# Patient Record
Sex: Female | Born: 1955 | Race: Black or African American | Hispanic: No | Marital: Single | Smoking: Former smoker
Health system: Southern US, Community
[De-identification: ages and names within clinical notes are randomized; demographics above are authoritative.]

## PROBLEM LIST (undated history)

## (undated) DIAGNOSIS — I10 Essential (primary) hypertension: Secondary | ICD-10-CM

## (undated) DIAGNOSIS — IMO0001 Reserved for inherently not codable concepts without codable children: Secondary | ICD-10-CM

## (undated) DIAGNOSIS — E119 Type 2 diabetes mellitus without complications: Secondary | ICD-10-CM

## (undated) DIAGNOSIS — Z794 Long term (current) use of insulin: Secondary | ICD-10-CM

## (undated) DIAGNOSIS — E785 Hyperlipidemia, unspecified: Secondary | ICD-10-CM

## (undated) DIAGNOSIS — J45909 Unspecified asthma, uncomplicated: Secondary | ICD-10-CM

## (undated) DIAGNOSIS — E039 Hypothyroidism, unspecified: Secondary | ICD-10-CM

## (undated) HISTORY — PX: JOINT REPLACEMENT: SHX530

---

## 2013-08-04 HISTORY — PX: TOTAL KNEE ARTHROPLASTY: SHX125

## 2015-06-12 ENCOUNTER — Encounter: Payer: Self-pay | Admitting: Emergency Medicine

## 2015-06-12 ENCOUNTER — Inpatient Hospital Stay: Admit: 2015-06-12 | Payer: Medicaid - Out of State

## 2015-06-12 ENCOUNTER — Emergency Department: Payer: Medicaid - Out of State

## 2015-06-12 ENCOUNTER — Inpatient Hospital Stay
Admission: EM | Admit: 2015-06-12 | Discharge: 2015-06-14 | DRG: 292 | Disposition: A | Discharge status: Home / Self Care | Payer: Medicaid - Out of State | Attending: Internal Medicine | Admitting: Internal Medicine

## 2015-06-12 DIAGNOSIS — I340 Nonrheumatic mitral (valve) insufficiency: Secondary | ICD-10-CM | POA: Diagnosis not present

## 2015-06-12 DIAGNOSIS — Z794 Long term (current) use of insulin: Secondary | ICD-10-CM

## 2015-06-12 DIAGNOSIS — J45909 Unspecified asthma, uncomplicated: Secondary | ICD-10-CM | POA: Diagnosis present

## 2015-06-12 DIAGNOSIS — E785 Hyperlipidemia, unspecified: Secondary | ICD-10-CM | POA: Diagnosis present

## 2015-06-12 DIAGNOSIS — I5031 Acute diastolic (congestive) heart failure: Secondary | ICD-10-CM | POA: Diagnosis present

## 2015-06-12 DIAGNOSIS — E039 Hypothyroidism, unspecified: Secondary | ICD-10-CM | POA: Diagnosis present

## 2015-06-12 DIAGNOSIS — Z6841 Body Mass Index (BMI) 40.0 and over, adult: Secondary | ICD-10-CM

## 2015-06-12 DIAGNOSIS — Z79899 Other long term (current) drug therapy: Secondary | ICD-10-CM | POA: Diagnosis not present

## 2015-06-12 DIAGNOSIS — R0601 Orthopnea: Secondary | ICD-10-CM | POA: Diagnosis present

## 2015-06-12 DIAGNOSIS — E876 Hypokalemia: Secondary | ICD-10-CM | POA: Diagnosis present

## 2015-06-12 DIAGNOSIS — Z87891 Personal history of nicotine dependence: Secondary | ICD-10-CM

## 2015-06-12 DIAGNOSIS — Z886 Allergy status to analgesic agent status: Secondary | ICD-10-CM

## 2015-06-12 DIAGNOSIS — E119 Type 2 diabetes mellitus without complications: Secondary | ICD-10-CM | POA: Diagnosis present

## 2015-06-12 DIAGNOSIS — Z823 Family history of stroke: Secondary | ICD-10-CM

## 2015-06-12 DIAGNOSIS — Z96659 Presence of unspecified artificial knee joint: Secondary | ICD-10-CM | POA: Diagnosis present

## 2015-06-12 DIAGNOSIS — I10 Essential (primary) hypertension: Secondary | ICD-10-CM

## 2015-06-12 DIAGNOSIS — Z8249 Family history of ischemic heart disease and other diseases of the circulatory system: Secondary | ICD-10-CM

## 2015-06-12 DIAGNOSIS — I509 Heart failure, unspecified: Secondary | ICD-10-CM

## 2015-06-12 DIAGNOSIS — R0602 Shortness of breath: Secondary | ICD-10-CM

## 2015-06-12 DIAGNOSIS — Z7951 Long term (current) use of inhaled steroids: Secondary | ICD-10-CM

## 2015-06-12 DIAGNOSIS — IMO0001 Reserved for inherently not codable concepts without codable children: Secondary | ICD-10-CM

## 2015-06-12 HISTORY — DX: Essential (primary) hypertension: I10

## 2015-06-12 HISTORY — DX: Long term (current) use of insulin: Z79.4

## 2015-06-12 HISTORY — DX: Hyperlipidemia, unspecified: E78.5

## 2015-06-12 HISTORY — DX: Hypothyroidism, unspecified: E03.9

## 2015-06-12 HISTORY — DX: Reserved for inherently not codable concepts without codable children: IMO0001

## 2015-06-12 HISTORY — DX: Type 2 diabetes mellitus without complications: E11.9

## 2015-06-12 HISTORY — DX: Unspecified asthma, uncomplicated: J45.909

## 2015-06-12 HISTORY — DX: Morbid (severe) obesity due to excess calories: E66.01

## 2015-06-12 LAB — BASIC METABOLIC PANEL
Anion gap: 10 (ref 5–15)
BUN: 26 mg/dL — ABNORMAL HIGH (ref 6–20)
CO2: 31 mmol/L (ref 22–32)
Calcium: 8.3 mg/dL — ABNORMAL LOW (ref 8.9–10.3)
Chloride: 95 mmol/L — ABNORMAL LOW (ref 101–111)
Creatinine, Ser: 1.29 mg/dL — ABNORMAL HIGH (ref 0.44–1.00)
GFR calc Af Amer: 51 mL/min — ABNORMAL LOW (ref >60)
GFR calc non Af Amer: 44 mL/min — ABNORMAL LOW (ref >60)
Glucose, Bld: 85 mg/dL (ref 65–99)
Potassium: 3 mmol/L — ABNORMAL LOW (ref 3.5–5.1)
Sodium: 136 mmol/L (ref 135–145)

## 2015-06-12 LAB — CBC WITH DIFFERENTIAL/PLATELET
BASOS PCT: 0 %
Basophils Absolute: 0 10*3/uL (ref 0–0.1)
EOS ABS: 0.2 10*3/uL (ref 0–0.7)
Eosinophils Relative: 2 %
HCT: 39.8 % (ref 35.0–47.0)
HEMOGLOBIN: 13 g/dL (ref 12.0–16.0)
LYMPHS PCT: 16 %
Lymphs Abs: 1.4 10*3/uL (ref 1.0–3.6)
MCH: 29.1 pg (ref 26.0–34.0)
MCHC: 32.5 g/dL (ref 32.0–36.0)
MCV: 89.6 fL (ref 80.0–100.0)
MONOS PCT: 7 %
Monocytes Absolute: 0.7 10*3/uL (ref 0.2–0.9)
NEUTROS PCT: 75 %
Neutro Abs: 6.6 10*3/uL — ABNORMAL HIGH (ref 1.4–6.5)
PLATELETS: 210 10*3/uL (ref 150–440)
RBC: 4.45 MIL/uL (ref 3.80–5.20)
RDW: 14.9 % — ABNORMAL HIGH (ref 11.5–14.5)
WBC: 8.9 10*3/uL (ref 3.6–11.0)

## 2015-06-12 LAB — GLUCOSE, CAPILLARY: Glucose-Capillary: 76 mg/dL (ref 65–99)

## 2015-06-12 LAB — MAGNESIUM: MAGNESIUM: 1.9 mg/dL (ref 1.7–2.4)

## 2015-06-12 LAB — BRAIN NATRIURETIC PEPTIDE: B NATRIURETIC PEPTIDE 5: 928 pg/mL — AB (ref 0.0–100.0)

## 2015-06-12 LAB — TROPONIN I: Troponin I: 0.03 ng/mL (ref <0.031)

## 2015-06-12 MED ORDER — HYDRALAZINE HCL 20 MG/ML IJ SOLN
10.0000 mg | Freq: Four times a day (QID) | INTRAMUSCULAR | Status: DC | PRN
  Administered 2015-06-12 (×2): 10 mg via INTRAVENOUS
  Filled 2015-06-12: qty 1

## 2015-06-12 MED ORDER — LOSARTAN POTASSIUM 50 MG PO TABS
100.0000 mg | ORAL_TABLET | Freq: Every day | ORAL | Status: DC
  Administered 2015-06-12 – 2015-06-14 (×3): 100 mg via ORAL
  Filled 2015-06-12 (×3): qty 2

## 2015-06-12 MED ORDER — INSULIN GLARGINE 100 UNIT/ML ~~LOC~~ SOLN
35.0000 [IU] | Freq: Every evening | SUBCUTANEOUS | Status: DC
  Administered 2015-06-12 – 2015-06-13 (×2): 35 [IU] via SUBCUTANEOUS
  Filled 2015-06-12 (×3): qty 0.35

## 2015-06-12 MED ORDER — ONDANSETRON HCL 4 MG PO TABS: 4.0000 mg | ORAL_TABLET | Freq: Four times a day (QID) | ORAL | Status: DC | PRN

## 2015-06-12 MED ORDER — ONDANSETRON HCL 4 MG/2ML IJ SOLN: 4.0000 mg | Freq: Four times a day (QID) | INTRAMUSCULAR | Status: DC | PRN

## 2015-06-12 MED ORDER — FUROSEMIDE 10 MG/ML IJ SOLN
INTRAMUSCULAR | Status: AC
  Administered 2015-06-12: 40 mg via INTRAVENOUS
  Filled 2015-06-12: qty 4

## 2015-06-12 MED ORDER — LEVOTHYROXINE SODIUM 125 MCG PO TABS
125.0000 ug | ORAL_TABLET | Freq: Every day | ORAL | Status: DC
  Administered 2015-06-13 – 2015-06-14 (×2): 125 ug via ORAL
  Filled 2015-06-12 (×2): qty 1

## 2015-06-12 MED ORDER — HYDRALAZINE HCL 20 MG/ML IJ SOLN
INTRAMUSCULAR | Status: AC
  Administered 2015-06-12: 10 mg via INTRAVENOUS
  Filled 2015-06-12: qty 1

## 2015-06-12 MED ORDER — FUROSEMIDE 10 MG/ML IJ SOLN
40.0000 mg | Freq: Two times a day (BID) | INTRAMUSCULAR | Status: DC
  Administered 2015-06-12 – 2015-06-14 (×3): 40 mg via INTRAVENOUS
  Filled 2015-06-12 (×3): qty 4

## 2015-06-12 MED ORDER — NITROGLYCERIN 2 % TD OINT
TOPICAL_OINTMENT | TRANSDERMAL | Status: AC
  Filled 2015-06-12: qty 1

## 2015-06-12 MED ORDER — FUROSEMIDE 40 MG PO TABS
ORAL_TABLET | ORAL | Status: AC
  Filled 2015-06-12: qty 1

## 2015-06-12 MED ORDER — ATORVASTATIN CALCIUM 20 MG PO TABS
40.0000 mg | ORAL_TABLET | Freq: Every day | ORAL | Status: DC
  Administered 2015-06-12 – 2015-06-14 (×3): 40 mg via ORAL
  Filled 2015-06-12 (×3): qty 2

## 2015-06-12 MED ORDER — BUDESONIDE-FORMOTEROL FUMARATE 160-4.5 MCG/ACT IN AERO
2.0000 | INHALATION_SPRAY | Freq: Two times a day (BID) | RESPIRATORY_TRACT | Status: DC
  Administered 2015-06-12 – 2015-06-14 (×4): 2 via RESPIRATORY_TRACT
  Filled 2015-06-12: qty 6

## 2015-06-12 MED ORDER — INSULIN ASPART 100 UNIT/ML ~~LOC~~ SOLN
0.0000 [IU] | Freq: Every day | SUBCUTANEOUS | Status: DC
  Administered 2015-06-13: 2 [IU] via SUBCUTANEOUS
  Filled 2015-06-12: qty 2

## 2015-06-12 MED ORDER — ASPIRIN 81 MG PO CHEW
CHEWABLE_TABLET | ORAL | Status: AC
  Administered 2015-06-12: 324 mg via ORAL
  Filled 2015-06-12: qty 4

## 2015-06-12 MED ORDER — ALBUTEROL SULFATE (2.5 MG/3ML) 0.083% IN NEBU: 2.5000 mg | INHALATION_SOLUTION | Freq: Four times a day (QID) | RESPIRATORY_TRACT | Status: DC | PRN

## 2015-06-12 MED ORDER — IPRATROPIUM-ALBUTEROL 0.5-2.5 (3) MG/3ML IN SOLN
3.0000 mL | Freq: Once | RESPIRATORY_TRACT | Status: AC
  Administered 2015-06-12: 3 mL via RESPIRATORY_TRACT
  Filled 2015-06-12: qty 3

## 2015-06-12 MED ORDER — SODIUM CHLORIDE 0.9 % IJ SOLN
3.0000 mL | Freq: Two times a day (BID) | INTRAMUSCULAR | Status: DC
  Administered 2015-06-12 – 2015-06-14 (×4): 3 mL via INTRAVENOUS

## 2015-06-12 MED ORDER — ENOXAPARIN SODIUM 40 MG/0.4ML ~~LOC~~ SOLN
40.0000 mg | Freq: Two times a day (BID) | SUBCUTANEOUS | Status: DC
  Administered 2015-06-12 – 2015-06-14 (×3): 40 mg via SUBCUTANEOUS
  Filled 2015-06-12 (×4): qty 0.4

## 2015-06-12 MED ORDER — INSULIN ASPART 100 UNIT/ML ~~LOC~~ SOLN
0.0000 [IU] | Freq: Three times a day (TID) | SUBCUTANEOUS | Status: DC
  Administered 2015-06-13: 5 [IU] via SUBCUTANEOUS
  Administered 2015-06-13: 2 [IU] via SUBCUTANEOUS
  Administered 2015-06-14: 3 [IU] via SUBCUTANEOUS
  Administered 2015-06-14: 2 [IU] via SUBCUTANEOUS
  Filled 2015-06-12: qty 2
  Filled 2015-06-12: qty 5
  Filled 2015-06-12: qty 3
  Filled 2015-06-12: qty 2

## 2015-06-12 MED ORDER — CARVEDILOL 25 MG PO TABS
25.0000 mg | ORAL_TABLET | Freq: Two times a day (BID) | ORAL | Status: DC
  Administered 2015-06-12 – 2015-06-14 (×4): 25 mg via ORAL
  Filled 2015-06-12 (×4): qty 1

## 2015-06-12 MED ORDER — CLONIDINE HCL 0.1 MG PO TABS
0.2000 mg | ORAL_TABLET | Freq: Two times a day (BID) | ORAL | Status: DC
  Administered 2015-06-12 – 2015-06-14 (×4): 0.2 mg via ORAL
  Filled 2015-06-12 (×4): qty 2

## 2015-06-12 MED ORDER — POTASSIUM CHLORIDE CRYS ER 20 MEQ PO TBCR
20.0000 meq | EXTENDED_RELEASE_TABLET | Freq: Two times a day (BID) | ORAL | Status: DC
  Administered 2015-06-12 – 2015-06-14 (×4): 20 meq via ORAL
  Filled 2015-06-12 (×3): qty 1

## 2015-06-12 MED ORDER — FUROSEMIDE 20 MG PO TABS
20.0000 mg | ORAL_TABLET | Freq: Once | ORAL | Status: AC
  Administered 2015-06-12: 20 mg via ORAL

## 2015-06-12 MED ORDER — NITROGLYCERIN 0.4 MG SL SUBL
0.4000 mg | SUBLINGUAL_TABLET | Freq: Once | SUBLINGUAL | Status: AC
  Administered 2015-06-12: 0.4 mg via SUBLINGUAL
  Filled 2015-06-12: qty 25

## 2015-06-12 MED ORDER — HYDRALAZINE HCL 25 MG PO TABS
25.0000 mg | ORAL_TABLET | Freq: Four times a day (QID) | ORAL | Status: DC
  Administered 2015-06-12 – 2015-06-13 (×2): 25 mg via ORAL
  Filled 2015-06-12 (×4): qty 1

## 2015-06-12 MED ORDER — NITROGLYCERIN 2 % TD OINT
1.0000 [in_us] | TOPICAL_OINTMENT | Freq: Once | TRANSDERMAL | Status: AC
  Administered 2015-06-12: 1 [in_us] via TOPICAL

## 2015-06-12 MED ORDER — ACETAMINOPHEN 325 MG PO TABS
650.0000 mg | ORAL_TABLET | Freq: Four times a day (QID) | ORAL | Status: DC | PRN
  Administered 2015-06-14: 650 mg via ORAL
  Filled 2015-06-12: qty 2

## 2015-06-12 MED ORDER — ASPIRIN EC 81 MG PO TBEC
81.0000 mg | DELAYED_RELEASE_TABLET | Freq: Every day | ORAL | Status: DC
  Administered 2015-06-12 – 2015-06-14 (×3): 81 mg via ORAL
  Filled 2015-06-12 (×3): qty 1

## 2015-06-12 MED ORDER — ISOSORBIDE MONONITRATE ER 30 MG PO TB24
30.0000 mg | ORAL_TABLET | Freq: Every day | ORAL | Status: DC
  Administered 2015-06-13 – 2015-06-14 (×2): 30 mg via ORAL
  Filled 2015-06-12 (×3): qty 1

## 2015-06-12 MED ORDER — ASPIRIN 81 MG PO CHEW
324.0000 mg | CHEWABLE_TABLET | Freq: Once | ORAL | Status: AC
  Administered 2015-06-12: 324 mg via ORAL
  Filled 2015-06-12: qty 4

## 2015-06-12 MED ORDER — ACETAMINOPHEN 650 MG RE SUPP: 650.0000 mg | Freq: Four times a day (QID) | RECTAL | Status: DC | PRN

## 2015-06-12 NOTE — ED Notes (Addendum)
Pt has history of asthma and reports wheezing with no relief from inhalers for the last 2 days. Pt unable to eat normally due to shortness of breath.

## 2015-06-12 NOTE — H&P (Signed)
[facility] - [place] at [facility]   PATIENT NAME: Ashley Graves    MR#:  [id number]  DATE OF BIRTH:  1956-03-22  DATE OF ADMISSION:  06/12/2015  PRIMARY CARE PHYSICIAN: Non-local but in [place]   REQUESTING/REFERRING PHYSICIAN: Dr. Daryel November  CHIEF COMPLAINT:   Chief Complaint  Patient presents with  . Wheezing   shortness of breath, lower extremity edema.  HISTORY OF PRESENT ILLNESS:  Ashley Graves  is a 59 y.o. female with a known history of hypertension, diabetes, asthma, obesity, who presents to the hospital with a one-week history of worsening shortness of breath and lower extremity edema. Patient does not have established physician in this area and is staying with her daughter for the past 2 months. Patient noticed that over the past week her lower extremities have been more swollen and she is having worsening exertional dyspnea. She also admits to 2-3 pillow orthopnea, paroxysmal nocturnal dyspnea.  Patient denies any chest pain, nausea, vomiting, diarrhea or any other associated symptoms. Patient was noted to have hypertensive urgency with systolic blood pressures over 409W and noted to be in congestive heart failure. Hospitalist services were contacted for further treatment and evaluation.  PAST MEDICAL HISTORY:   Past Medical History  Diagnosis Date  . Hypertension   . Diabetes mellitus without complication   . Asthma   . Thyroid disease     PAST SURGICAL HISTORY:   Past Surgical History  Procedure Laterality Date  . Joint replacement    . Total knee arthroplasty  08/04/2013    SOCIAL HISTORY:   History  Substance Use Topics  . Smoking status: Former Smoker -- 0.50 packs/day for 30 years    Types: Cigarettes  . Smokeless tobacco: Former Neurosurgeon    Quit date: 11/05/1999  . Alcohol Use: No    FAMILY HISTORY:   Family History  Problem Relation Age of Onset  . CVA Mother   . Congestive Heart Failure Father     DRUG  ALLERGIES:   Allergies  Allergen Reactions  . Codeine Other (See Comments)    Reaction:  Hallucinations     REVIEW OF SYSTEMS:   Review of Systems  Constitutional: Negative for fever and weight loss.  HENT: Negative for congestion, nosebleeds and tinnitus.   Eyes: Negative for blurred vision, double vision and redness.  Respiratory: Positive for cough, shortness of breath and wheezing. Negative for hemoptysis.   Cardiovascular: Positive for leg swelling. Negative for chest pain, orthopnea and PND.  Gastrointestinal: Positive for nausea. Negative for vomiting, abdominal pain, diarrhea and melena.  Genitourinary: Negative for dysuria, urgency and hematuria.  Musculoskeletal: Negative for joint pain and falls.  Neurological: Negative for dizziness, tingling, sensory change, focal weakness, seizures, weakness and headaches.  Endo/Heme/Allergies: Negative for polydipsia. Does not bruise/bleed easily.  Psychiatric/Behavioral: Negative for depression and memory loss. The patient is not nervous/anxious.     MEDICATIONS AT HOME:   Prior to Admission medications   Medication Sig Start Date End Date Taking? Authorizing Provider  albuterol (PROVENTIL HFA;VENTOLIN HFA) 108 (90 BASE) MCG/ACT inhaler Inhale 2 puffs into the lungs every 6 (six) hours as needed for wheezing or shortness of breath.   Yes Historical Provider, MD  aspirin EC 81 MG tablet Take 81 mg by mouth daily.   Yes Historical Provider, MD  atorvastatin (LIPITOR) 40 MG tablet Take 40 mg by mouth daily.   Yes Historical Provider, MD  budesonide-formoterol (SYMBICORT) 160-4.5 MCG/ACT inhaler Inhale 2 puffs into the lungs  2 (two) times daily.   Yes Historical Provider, MD  carvedilol (COREG) 25 MG tablet Take 25 mg by mouth 2 (two) times daily.   Yes Historical Provider, MD  cloNIDine (CATAPRES) 0.1 MG tablet Take 0.1 mg by mouth 2 (two) times daily.   Yes Historical Provider, MD  hydrALAZINE (APRESOLINE) 25 MG tablet Take 25 mg by  mouth 2 (two) times daily.   Yes Historical Provider, MD  insulin glargine (LANTUS) 100 UNIT/ML injection Inject 35 Units into the skin every evening.   Yes Historical Provider, MD  insulin lispro (HUMALOG) 100 UNIT/ML injection Inject 7 Units into the skin 3 (three) times daily before meals.   Yes Historical Provider, MD  isosorbide mononitrate (IMDUR) 30 MG 24 hr tablet Take 30 mg by mouth daily.   Yes Historical Provider, MD  levothyroxine (SYNTHROID, LEVOTHROID) 125 MCG tablet Take 125 mcg by mouth daily before breakfast.   Yes Historical Provider, MD  losartan (COZAAR) 100 MG tablet Take 100 mg by mouth daily.   Yes Historical Provider, MD  naproxen sodium (ANAPROX) 220 MG tablet Take 440 mg by mouth daily.   Yes Historical Provider, MD      VITAL SIGNS:  Blood pressure 251/119, pulse 78, temperature 98.6 F (37 C), temperature source Oral, resp. rate 20, height  (1.575 m), weight 108.863 kg (240 lb), SpO2 95 %.  PHYSICAL EXAMINATION:  Physical Exam  GENERAL:  59 y.o.-year-old obese patient lying in the bed with no acute distress.  EYES: Pupils equal, round, reactive to light and accommodation. No scleral icterus. Extraocular muscles intact.  HEENT: Head atraumatic, normocephalic. Oropharynx and nasopharynx clear. No oropharyngeal erythema, moist oral mucosa  NECK:  Supple, no jugular venous distention. No thyroid enlargement, no tenderness.  LUNGS: Positive end expiratory wheezing. Positive bibasilar rales. Positive use of accessory muscles. No dullness to percussion. No rhonchi. CARDIOVASCULAR: S1, S2 RRR. No murmurs, rubs, gallops, clicks.  ABDOMEN: Soft, nontender, nondistended. Bowel sounds present. No organomegaly or mass.  EXTREMITIES: + 2 pitting edema from knees to ankles bilaterally, no cyanosis, or clubbing. + 2 pedal & radial pulses b/l.   NEUROLOGIC: Cranial nerves II through XII are intact. No focal Motor or sensory deficits appreciated b/l PSYCHIATRIC: The patient  is alert and oriented x 3. Good affect.  SKIN: No obvious rash, lesion, or ulcer.   LABORATORY PANEL:   CBC  Recent Labs Lab 06/12/15 1537  WBC 8.9  HGB 13.0  HCT 39.8  PLT 210   ------------------------------------------------------------------------------------------------------------------  Chemistries   Recent Labs Lab 06/12/15 1537  NA 136  K 3.0*  CL 95*  CO2 31  GLUCOSE 85  BUN 26*  CREATININE 1.29*  CALCIUM 8.3*   ------------------------------------------------------------------------------------------------------------------  Cardiac Enzymes  Recent Labs Lab 06/12/15 1537  TROPONINI 0.03   ------------------------------------------------------------------------------------------------------------------  RADIOLOGY:  Dg Chest 2 View  06/12/2015   CLINICAL DATA:  Mid chest pain.  Shortness of breath.  EXAM: CHEST  2 VIEW  COMPARISON:  None.  FINDINGS: There is cardiomegaly. There are tiny bilateral effusions. Pulmonary vascularity is within normal limits. Tiny areas atelectasis at both lung bases laterally. No osseous abnormality.  IMPRESSION: Cardiomegaly with tiny effusions and minimal bibasilar atelectasis. The possibility of congestive heart failure should be considered.   Electronically Signed   By: Francene Boyers M.D.   On: 06/12/2015 16:05     IMPRESSION AND PLAN:   59 year old female with past medical history of obesity, diabetes, hypertension, asthma who presents to the hospital due  to worsening lower extremity edema and shortness of breath and noted to be in congestive heart failure.   #1 CHF-this is likely cause of patient's shortness of breath, lower extremity edema. -We'll diurese with IV Lasix, follow I's and O's, daily weights. -Continue Coreg. We'll check a two-dimensional echocardiogram. We'll get a cardiology consult.  #2 hypertensive urgency-patient's systolic blood pressures over 782. I suspect this probably related to  noncompliance. -Continue Coreg, Imdur, clonidine, hydralazine. I will advance some of the doses. -We'll add when necessary hydralazine. Follow blood pressure.  #3 diabetes-continue Lantus, sliding scale insulin. We will check a hemoglobin A1c.  #4 hypothyroidism-continue Synthroid.  #5 asthma-no acute exacerbation. Continue Symbicort.  #6 Hypokalemia - will replace potassium.  Check Mg. Level.     All the records are reviewed and case discussed with ED provider. Management plans discussed with the patient, family and they are in agreement.  CODE STATUS: Full  TOTAL TIME TAKING CARE OF THIS PATIENT: 45 minutes.    Houston Siren M.D on 06/12/2015 at 5:31 PM  Between 7am to 6pm - Pager - [phone]  After 6pm go to [email or web address] - password [email or web address] [facility]  [facility]  Office  [phone]  CC: Primary care physician; No primary care provider on file.

## 2015-06-12 NOTE — Progress Notes (Signed)
ANTICOAGULATION CONSULT NOTE - Initial Consult  Pharmacy Consult for Lovenox Indication: VTE prophylaxis  Allergies  Allergen Reactions  . Codeine Other (See Comments)    Reaction:  Hallucinations     Patient Measurements: Height:  (157.5 cm) Weight: 252 lb 11.2 oz (114.624 kg) IBW/kg (Calculated) : 50.1 Heparin Dosing Weight:   Vital Signs: Temp: 99.5 F (37.5 C) (08/08 1858) Temp Source: Oral (08/08 1858) BP: 204/95 mmHg (08/08 1858) Pulse Rate: 84 (08/08 1858)  Labs:  Recent Labs  06/12/15 1537  HGB 13.0  HCT 39.8  PLT 210  CREATININE 1.29*  TROPONINI 0.03    Estimated Creatinine Clearance: 55.6 mL/min (by C-G formula based on Cr of 1.29).   Medical History: Past Medical History  Diagnosis Date  . Hypertension   . Diabetes mellitus without complication   . Asthma   . Thyroid disease     Medications:  Prescriptions prior to admission  Medication Sig Dispense Refill Last Dose  . albuterol (PROVENTIL HFA;VENTOLIN HFA) 108 (90 BASE) MCG/ACT inhaler Inhale 2 puffs into the lungs every 6 (six) hours as needed for wheezing or shortness of breath.   06/12/2015 at Unknown time  . aspirin EC 81 MG tablet Take 81 mg by mouth daily.   06/11/2015 at Unknown time  . atorvastatin (LIPITOR) 40 MG tablet Take 40 mg by mouth daily.   06/11/2015 at Unknown time  . budesonide-formoterol (SYMBICORT) 160-4.5 MCG/ACT inhaler Inhale 2 puffs into the lungs 2 (two) times daily.   06/12/2015 at Unknown time  . carvedilol (COREG) 25 MG tablet Take 25 mg by mouth 2 (two) times daily.   06/11/2015 at 2200  . cloNIDine (CATAPRES) 0.1 MG tablet Take 0.1 mg by mouth 2 (two) times daily.   06/11/2015 at Unknown time  . hydrALAZINE (APRESOLINE) 25 MG tablet Take 25 mg by mouth 2 (two) times daily.   06/11/2015 at Unknown time  . insulin glargine (LANTUS) 100 UNIT/ML injection Inject 35 Units into the skin every evening.   06/11/2015 at Unknown time  . insulin lispro (HUMALOG) 100 UNIT/ML injection  Inject 7 Units into the skin 3 (three) times daily before meals.   06/11/2015 at Unknown time  . isosorbide mononitrate (IMDUR) 30 MG 24 hr tablet Take 30 mg by mouth daily.   06/11/2015 at Unknown time  . levothyroxine (SYNTHROID, LEVOTHROID) 125 MCG tablet Take 125 mcg by mouth daily before breakfast.   06/11/2015 at Unknown time  . losartan (COZAAR) 100 MG tablet Take 100 mg by mouth daily.   06/11/2015 at Unknown time  . naproxen sodium (ANAPROX) 220 MG tablet Take 440 mg by mouth daily.   06/11/2015 at 1000    Assessment: CrCl = 55.6 ml/min BMI = 44   Goal of Therapy:  Prevention of DVT    Plan:  Lovenox 40 mg SQ Q24H originally ordered. Will adjust dose to Lovenox 40 mg SQ Q12H based on BMI > 40.   Nayah Lukens D 06/12/2015,7:38 PM

## 2015-06-12 NOTE — Progress Notes (Signed)
Pt's blood pressure remains high.  Medicated per MAR.  Will recheck when pt returns from [place].   Cristela Felt, RN

## 2015-06-12 NOTE — ED Provider Notes (Signed)
[facility] Emergency Department Provider Note     Time seen: ----------------------------------------- 3:30 PM on 06/12/2015 -----------------------------------------    I have reviewed the triage vital signs and the nursing notes.   HISTORY  Chief Complaint Wheezing    HPI Ashley Graves is a 59 y.o. female who presents ER with difficulty breathing for the last 2 days. Patient reports being increasingly short of breath, has had this happen to her in the past. Reports she sleeps on 3 pillows at night, has dyspnea on exertion. Reports she has fluid on her extremities which is new. She does report history of hypertension diabetes. Difficult to breathing is moderate to severe at this point   Past Medical History  Diagnosis Date  . Hypertension   . Diabetes mellitus without complication   . Asthma   . Thyroid disease     There are no active problems to display for this patient.   Past Surgical History  Procedure Laterality Date  . Joint replacement    . Total knee arthroplasty  08/04/2013    Allergies Codeine  Social History History  Substance Use Topics  . Smoking status: Former Games developer  . Smokeless tobacco: Former Neurosurgeon    Quit date: 11/05/1999  . Alcohol Use: No    Review of Systems Constitutional: Negative for fever. Eyes: Negative for visual changes. ENT: Negative for sore throat. Cardiovascular: Negative for chest pain. Respiratory: Positive for shortness of breath Gastrointestinal: Negative for abdominal pain, vomiting and diarrhea. Genitourinary: Negative for dysuria. Musculoskeletal: Negative for back pain. Positive for edema Skin: Negative for rash. Neurological: Negative for headaches, focal weakness or numbness.  10-point ROS otherwise negative.  ____________________________________________   PHYSICAL EXAM:  VITAL SIGNS: ED Triage Vitals  Enc Vitals Group     BP 06/12/15 1522 236/93 mmHg     Pulse Rate 06/12/15  1522 80     Resp --      Temp 06/12/15 1522 98.6 F (37 C)     Temp Source 06/12/15 1522 Oral     SpO2 06/12/15 1522 100 %     Weight 06/12/15 1522 240 lb (108.863 kg)     Height 06/12/15 1522  (1.575 m)     Head Cir --      Peak Flow --      Pain Score 06/12/15 1523 3     Pain Loc --      Pain Edu? --      Excl. in GC? --     Constitutional: Alert and oriented. Well appearing, mild distress Eyes: Conjunctivae are normal. PERRL. Normal extraocular movements. ENT   Head: Normocephalic and atraumatic.   Nose: No congestion/rhinnorhea.   Mouth/Throat: Mucous membranes are moist.   Neck: No stridor. Cardiovascular: Normal rate, regular rhythm. Normal and symmetric distal pulses are present in all extremities. No murmurs, rubs, or gallops. Respiratory: Normal respiratory effort without tachypnea nor retractions, decreased breath sounds bilaterally. No wheezes/rales/rhonchi. Gastrointestinal: Soft and nontender. No distention. No abdominal bruits.  Musculoskeletal: Nontender with normal range of motion in all extremities. No joint effusions.  No lower extremity tenderness, bilateral mild pitting edema Neurologic:  Normal speech and language. No gross focal neurologic deficits are appreciated. Speech is normal. No gait instability. Skin:  Skin is warm, dry and intact. No rash noted. Psychiatric: Mood and affect are normal. Speech and behavior are normal. Patient exhibits appropriate insight and judgment. ____________________________________________  EKG: Interpreted by me. Normal sinus rhythm with occasional PVCs, rate is 81 bpm,  wide QRS, normal PR interval. Long QT interval.  ____________________________________________  ED COURSE:  Pertinent labs & imaging results that were available during my care of the patient were reviewed by me and considered in my medical decision making (see chart for details). Patiently cardiac workup to evaluate for heart failure, is  markedly hypertensive. ____________________________________________    LABS (pertinent positives/negatives)  Labs Reviewed  CBC WITH DIFFERENTIAL/PLATELET - Abnormal; Notable for the following:    RDW 14.9 (*)    Neutro Abs 6.6 (*)    All other components within normal limits  BASIC METABOLIC PANEL - Abnormal; Notable for the following:    Potassium 3.0 (*)    Chloride 95 (*)    BUN 26 (*)    Creatinine, Ser 1.29 (*)    Calcium 8.3 (*)    GFR calc non Af Amer 44 (*)    GFR calc Af Amer 51 (*)    All other components within normal limits  BRAIN NATRIURETIC PEPTIDE - Abnormal; Notable for the following:    B Natriuretic Peptide 928.0 (*)    All other components within normal limits  TROPONIN I    RADIOLOGY Images were viewed by me  Chest x-ray IMPRESSION: Cardiomegaly with tiny effusions and minimal bibasilar atelectasis. The possibility of congestive heart failure should be considered. ____________________________________________  FINAL ASSESSMENT AND PLAN  Dyspnea, new onset congestive heart failure  Plan: Patient with labs and imaging as dictated above. Patient be started on nitroglycerin as well as Lasix. We will add a baby aspirin if she started taking one, will need to be admitted for diuresis and echocardiogram.   Emily Filbert, MD   Emily Filbert, MD 06/12/15 [phone]

## 2015-06-13 ENCOUNTER — Inpatient Hospital Stay (HOSPITAL_COMMUNITY)
Admit: 2015-06-13 | Discharge: 2015-06-13 | Disposition: A | Discharge status: Home / Self Care | Payer: Medicaid - Out of State | Attending: Specialist | Admitting: Specialist

## 2015-06-13 ENCOUNTER — Encounter: Payer: Self-pay | Admitting: Student

## 2015-06-13 DIAGNOSIS — I340 Nonrheumatic mitral (valve) insufficiency: Secondary | ICD-10-CM

## 2015-06-13 DIAGNOSIS — IMO0001 Reserved for inherently not codable concepts without codable children: Secondary | ICD-10-CM

## 2015-06-13 DIAGNOSIS — I5031 Acute diastolic (congestive) heart failure: Secondary | ICD-10-CM | POA: Insufficient documentation

## 2015-06-13 DIAGNOSIS — E119 Type 2 diabetes mellitus without complications: Secondary | ICD-10-CM

## 2015-06-13 DIAGNOSIS — Z794 Long term (current) use of insulin: Secondary | ICD-10-CM

## 2015-06-13 DIAGNOSIS — E785 Hyperlipidemia, unspecified: Secondary | ICD-10-CM

## 2015-06-13 DIAGNOSIS — I10 Essential (primary) hypertension: Secondary | ICD-10-CM | POA: Diagnosis present

## 2015-06-13 DIAGNOSIS — E039 Hypothyroidism, unspecified: Secondary | ICD-10-CM | POA: Diagnosis present

## 2015-06-13 LAB — GLUCOSE, CAPILLARY
GLUCOSE-CAPILLARY: 79 mg/dL (ref 65–99)
GLUCOSE-CAPILLARY: 137 mg/dL — AB (ref 65–99)
GLUCOSE-CAPILLARY: 219 mg/dL — AB (ref 65–99)
Glucose-Capillary: 215 mg/dL — ABNORMAL HIGH (ref 65–99)

## 2015-06-13 LAB — CBC
HEMATOCRIT: 38.4 % (ref 35.0–47.0)
Hemoglobin: 12.3 g/dL (ref 12.0–16.0)
MCH: 28.9 pg (ref 26.0–34.0)
MCHC: 32.1 g/dL (ref 32.0–36.0)
MCV: 90.1 fL (ref 80.0–100.0)
PLATELETS: 202 10*3/uL (ref 150–440)
RBC: 4.26 MIL/uL (ref 3.80–5.20)
RDW: 14.7 % — AB (ref 11.5–14.5)
WBC: 8.5 10*3/uL (ref 3.6–11.0)

## 2015-06-13 LAB — BASIC METABOLIC PANEL
ANION GAP: 9 (ref 5–15)
BUN: 26 mg/dL — ABNORMAL HIGH (ref 6–20)
CALCIUM: 7.8 mg/dL — AB (ref 8.9–10.3)
CHLORIDE: 99 mmol/L — AB (ref 101–111)
CO2: 36 mmol/L — AB (ref 22–32)
Creatinine, Ser: 1.15 mg/dL — ABNORMAL HIGH (ref 0.44–1.00)
GFR calc Af Amer: 59 mL/min — ABNORMAL LOW (ref >60)
GFR calc non Af Amer: 51 mL/min — ABNORMAL LOW (ref >60)
GLUCOSE: 157 mg/dL — AB (ref 65–99)
POTASSIUM: 3.2 mmol/L — AB (ref 3.5–5.1)
Sodium: 144 mmol/L (ref 135–145)

## 2015-06-13 MED ORDER — HYDRALAZINE HCL 50 MG PO TABS
100.0000 mg | ORAL_TABLET | Freq: Four times a day (QID) | ORAL | Status: DC
  Administered 2015-06-13 – 2015-06-14 (×5): 100 mg via ORAL
  Filled 2015-06-13 (×5): qty 2

## 2015-06-13 MED ORDER — HYDRALAZINE HCL 50 MG PO TABS: 100.0000 mg | ORAL_TABLET | Freq: Three times a day (TID) | ORAL | Status: DC

## 2015-06-13 MED ORDER — POTASSIUM CHLORIDE CRYS ER 20 MEQ PO TBCR
40.0000 meq | EXTENDED_RELEASE_TABLET | Freq: Once | ORAL | Status: AC
  Administered 2015-06-13: 40 meq via ORAL
  Filled 2015-06-13 (×2): qty 2

## 2015-06-13 NOTE — Evaluation (Signed)
Physical Therapy Evaluation Patient Details Name: Ashley Graves MRN: [id number] DOB: 10/18/1956 Today's Date: 06/13/2015   History of Present Illness  Pt is a 59 y.o. female from [place] visiting her son and family presenting with 1 week h/o worsening SOB and LE edema; pt admitted with new onset CHF and hypertensive urgency.  Clinical Impression  Currently pt demonstrates impairments with general strength, dyspnea on exertion, and limitations with functional mobility.  Prior to admission, pt was independent ambulating with [place].  Pt is staying currently with her son and family and sleeps on the 2nd floor.  Pt had difficulty with stairs prior to admission (pt reports h/o R knee replacement and R knee issues and limits her stair climbing as much as possible d/t this).  Currently pt is CGA with transfers and ambulation with [place]; ambulation distance limited d/t dyspnea on exertion (O2 sats >90% on room air with activity)--pt otherwise fairly steady with [place].  Pt would benefit from skilled PT to address above noted impairments and functional limitations during hospital stay.  Recommend pt discharge to home with support of family when medically appropriate.     Follow Up Recommendations No PT follow up    Equipment Recommendations       Recommendations for Other Services       Precautions / Restrictions Precautions Precautions: Fall Restrictions Weight Bearing Restrictions: No      Mobility  Bed Mobility Overal bed mobility: Needs Assistance Bed Mobility: Supine to Sit;Sit to Supine     Supine to sit: Supervision;HOB elevated Sit to supine: Supervision;HOB elevated   General bed mobility comments: Increased time to perform with some SOB noted  Transfers Overall transfer level: Needs assistance Equipment used: Straight cane Transfers: Sit to/from [employer] Sit to Stand: Min guard Stand pivot transfers: Min guard (transfer to commode)       General transfer  comment: no loss of balance noted  Ambulation/Gait Ambulation/Gait assistance: Min guard Ambulation Distance (Feet): 50 Feet Assistive device: Straight cane Gait Pattern/deviations: Decreased step length - right;Decreased step length - left;Decreased stride length Gait velocity: decreased   General Gait Details: pt requiring 2 standing rest breaks d/t SOB (O2 >92% on room air)  Stairs            Wheelchair Mobility    Modified Rankin (Stroke Patients Only)       Balance Overall balance assessment: Needs assistance Sitting-balance support: No upper extremity supported;Feet supported Sitting balance-Leahy Scale: Good     Standing balance support: During functional activity Standing balance-Leahy Scale: Good                               Pertinent Vitals/Pain Pain Assessment: No/denies pain  O2 >90% on room air during session.    Home Living Family/patient expects to be discharged to:: Private residence Living Arrangements: Children (Pt's son and family) Available Help at Discharge: Family Type of Home: House Home Access: Stairs to enter Entrance Stairs-Rails: None Entrance Stairs-Number of Steps: 1 Home Layout: Two level Home Equipment: Cane - single point      Prior Function Level of Independence: Independent with assistive device(s)         Comments: Using self standing [place]     Hand Dominance        Extremity/Trunk Assessment   Upper Extremity Assessment: Overall WFL for tasks assessed           Lower Extremity Assessment: Generalized weakness  Communication   Communication: No difficulties  Cognition Arousal/Alertness: Awake/alert Behavior During Therapy: WFL for tasks assessed/performed Overall Cognitive Status: Within Functional Limits for tasks assessed                      General Comments   Nursing cleared pt for participation in physical therapy.  Pt agreeable to PT session.    Exercises   Treatment:  Performed semi-supine B LE therapeutic exercise x 10 reps:  Ankle pumps (AROM B LE's); quad sets x3 second holds (AROM B LE's); glute squeezes x3 second holds (AROM B); SAQ's (AROM R; AROM L); heelslides (AAROM R; AROM L), hip abd/adduction (AROM R; AROM L).  Pt required vc's and tactile cues for correct technique with exercises.       Assessment/Plan    PT Assessment Patient needs continued PT services  PT Diagnosis Generalized weakness   PT Problem List Decreased strength;Decreased activity tolerance;Decreased balance;Decreased mobility;Cardiopulmonary status limiting activity  PT Treatment Interventions DME instruction;Gait training;Stair training;Functional mobility training;Therapeutic activities;Therapeutic exercise;Balance training;Patient/family education   PT Goals (Current goals can be found in the Care Plan section) Acute Rehab PT Goals Patient Stated Goal: To go to her son's home PT Goal Formulation: With patient Time For Goal Achievement: 06/27/15 Potential to Achieve Goals: Good    Frequency Min 2X/week   Barriers to discharge        Co-evaluation               End of Session Equipment Utilized During Treatment: Gait belt Activity Tolerance: Other (comment) (Limited d/t SOB on exertion) Patient left: in bed;with call bell/phone within reach;with bed alarm set           Time: 1610-9604 PT Time Calculation (min) (ACUTE ONLY): 30 min   Charges:   PT Evaluation $Initial PT Evaluation Tier I: 1 Procedure PT Treatments $Therapeutic Exercise: 8-22 mins   PT G CodesHendricks Limes 06-29-15, 4:22 PM Hendricks Limes, PT [phone]

## 2015-06-13 NOTE — Consult Note (Addendum)
Cardiology Consultation Note  Patient ID: Ashley Graves, MRN: [id number], DOB/AGE: [birth year] 59 y.o. Admit date: 06/12/2015   Date of Consult: 06/13/2015 Primary Physician: No primary care provider on file. Primary Cardiologist: New to [facility]  Chief Complaint: Shortness of breath and wheezing Reason for Consult: CHF and hypertensive urgency  HPI: 59 y.o. female with h/o hypertension, hyperlipidemia,  insulin dependant diabetes, asthma, hypothyroidism, and obesity class 3 (BMI 46.2) , who presented to the [facility] ED on 8/8 complaining of 2 days of worsening shortness of breath, wheezing, dyspnea on exertion, and lower extremity edema.  Ashley Graves is from [place] and saw a Cardiologist at [facility] approximately a year ago.  She was referred to the cardiologist by her PCP after a hospitalization for worsening shortness of breath.  She states she was told she had uncontrolled hypertension at the cardiology appointment, but to her knowledge they did not do an Echo or ischemic workup.  Care Everywhere was utilized to get records from [place] ([facility] [place] and [facility] both had records).  She has not seen a cardiologist in [place].  Echo from this admission is pending.  She states that starting 4 months ago she noticed herself becoming weaker, 1 week ago she noticed new-onset leg edema, and 3 days ago (8/6) she noticed worsening shortness of breath.  She says that on Sunday (8/7), she had a very brief moment of chest pressure that immediately went away.  Otherwise, she says she has no other associated symptoms.  Her father had CHF, but other than being told a year ago that she had hypertension, she has never been told she had any cardiac problems.  At baseline, she sleeps on 3 pillows (that has been her normal for many years) and that number has not increased lately.  She states she does not eat out at restaurants more than twice a month in [place], does not use salt, and eats a lot of  fresh vegetables (never canned).  She has been staying with her son and daughter-in-law for the last 2 months, and her daughter-in-law cooks different foods than she is used to eating: fried chicken, spaghetti, and [place] bacon; and she also eats out quite frequently.  She has noticed a decrease in her activity level over the last few weeks, and at this point she gets short of breath just going to the bathroom.  She also occasionally gets dizzy during these episodes.  Prior to the last week, she used her Symbicort daily as prescribed but rarely had to use ProAir; however, recently she has been using her rescue inhaler just to go to the bathroom.  She has hear herself wheeze over the last 3 days.  She has had no recent colds recently, "not even a sniffle".  She has been taking all of her medications as prescribed.  Tele: NSR, 60's to 70's BPM, frequent PVC's, 1st degree heart block; K+: 3.2; Troponin negative x1; Echo pending; X-ray: cardiomegaly with tiny effusions and minimal bibasilar atelectasis, consider CHF  Past Medical History  Diagnosis Date  . Hypertension   . Insulin dependent diabetes mellitus   . Asthma   . Hypothyroidism   . Hyperlipidemia   . Obesity, Class III, BMI 40-49.9 (morbid obesity)     BMI 46.2      Most Recent Cardiac Studies: Echo (06/12/15): -Pending   Surgical History:  Past Surgical History  Procedure Laterality Date  . Joint replacement    . Total knee arthroplasty  08/04/2013  Home Meds: Prior to Admission medications   Medication Sig Start Date End Date Taking? Authorizing Provider  albuterol (PROVENTIL HFA;VENTOLIN HFA) 108 (90 BASE) MCG/ACT inhaler Inhale 2 puffs into the lungs every 6 (six) hours as needed for wheezing or shortness of breath.   Yes Historical Provider, MD  aspirin EC 81 MG tablet Take 81 mg by mouth daily.   Yes Historical Provider, MD  atorvastatin (LIPITOR) 40 MG tablet Take 40 mg by mouth daily.   Yes Historical Provider, MD    budesonide-formoterol (SYMBICORT) 160-4.5 MCG/ACT inhaler Inhale 2 puffs into the lungs 2 (two) times daily.   Yes Historical Provider, MD  carvedilol (COREG) 25 MG tablet Take 25 mg by mouth 2 (two) times daily.   Yes Historical Provider, MD  cloNIDine (CATAPRES) 0.1 MG tablet Take 0.1 mg by mouth 2 (two) times daily.   Yes Historical Provider, MD  hydrALAZINE (APRESOLINE) 25 MG tablet Take 25 mg by mouth 2 (two) times daily.   Yes Historical Provider, MD  insulin glargine (LANTUS) 100 UNIT/ML injection Inject 35 Units into the skin every evening.   Yes Historical Provider, MD  insulin lispro (HUMALOG) 100 UNIT/ML injection Inject 7 Units into the skin 3 (three) times daily before meals.   Yes Historical Provider, MD  isosorbide mononitrate (IMDUR) 30 MG 24 hr tablet Take 30 mg by mouth daily.   Yes Historical Provider, MD  levothyroxine (SYNTHROID, LEVOTHROID) 125 MCG tablet Take 125 mcg by mouth daily before breakfast.   Yes Historical Provider, MD  losartan (COZAAR) 100 MG tablet Take 100 mg by mouth daily.   Yes Historical Provider, MD  naproxen sodium (ANAPROX) 220 MG tablet Take 440 mg by mouth daily.   Yes Historical Provider, MD    Inpatient Medications:  . aspirin EC  81 mg Oral Daily  . atorvastatin  40 mg Oral Daily  . budesonide-formoterol  2 puff Inhalation BID  . carvedilol  25 mg Oral BID  . cloNIDine  0.2 mg Oral BID  . enoxaparin (LOVENOX) injection  40 mg Subcutaneous Q12H  . furosemide  40 mg Intravenous Q12H  . hydrALAZINE  25 mg Oral 4 times per day  . insulin aspart  0-15 Units Subcutaneous TID WC  . insulin aspart  0-5 Units Subcutaneous QHS  . insulin glargine  35 Units Subcutaneous QPM  . isosorbide mononitrate  30 mg Oral Daily  . levothyroxine  125 mcg Oral QAC breakfast  . losartan  100 mg Oral Daily  . potassium chloride  20 mEq Oral BID  . sodium chloride  3 mL Intravenous Q12H      Allergies:  Allergies  Allergen Reactions  . Codeine Other (See  Comments)    Reaction:  Hallucinations     History   Social History  . Marital Status: Single    Spouse Name: N/A  . Number of Children: N/A  . Years of Education: N/A   Occupational History  . Not on file.   Social History Main Topics  . Smoking status: Former Smoker -- 0.50 packs/day for 30 years    Types: Cigarettes  . Smokeless tobacco: Former Neurosurgeon    Quit date: 11/05/1999  . Alcohol Use: No  . Drug Use: No  . Sexual Activity: Not on file   Other Topics Concern  . Not on file   Social History Narrative     Family History  Problem Relation Age of Onset  . CVA Mother   . Congestive Heart Failure  Father      Review of Systems: Review of Systems  Constitutional: Positive for malaise/fatigue. Negative for fever, chills, weight loss and diaphoresis.  HENT: Negative for congestion and nosebleeds.   Eyes: Negative for discharge and redness.  Respiratory: Positive for shortness of breath and wheezing. Negative for cough and sputum production.   Cardiovascular: Positive for chest pain and leg swelling. Negative for palpitations, orthopnea and PND.       On Sunday (8/7), she had a very brief moment of chest pressure that immediately went away  Gastrointestinal: Negative for nausea, vomiting and abdominal pain.  Musculoskeletal: Negative for falls.  Skin: Negative for rash.  Neurological: Positive for dizziness and weakness. Negative for loss of consciousness.       Occasionally when walking to or from the bathroom  Endo/Heme/Allergies: Does not bruise/bleed easily.  Psychiatric/Behavioral: Negative for substance abuse. The patient is not nervous/anxious.   All other systems reviewed and are negative.   Labs:  Recent Labs  06/12/15 1537  TROPONINI 0.03   Lab Results  Component Value Date   WBC 8.5 06/13/2015   HGB 12.3 06/13/2015   HCT 38.4 06/13/2015   MCV 90.1 06/13/2015   PLT 202 06/13/2015     Recent Labs Lab 06/13/15 0400  NA 144  K 3.2*  CL 99*   CO2 36*  BUN 26*  CREATININE 1.15*  CALCIUM 7.8*  GLUCOSE 157*   No results found for: CHOL, HDL, LDLCALC, TRIG No results found for: DDIMER  Radiology/Studies:  Dg Chest 2 View  06/12/2015   CLINICAL DATA:  Mid chest pain.  Shortness of breath.  EXAM: CHEST  2 VIEW  COMPARISON:  None.  FINDINGS: There is cardiomegaly. There are tiny bilateral effusions. Pulmonary vascularity is within normal limits. Tiny areas atelectasis at both lung bases laterally. No osseous abnormality.  IMPRESSION: Cardiomegaly with tiny effusions and minimal bibasilar atelectasis. The possibility of congestive heart failure should be considered.   Electronically Signed   By: Francene Boyers M.D.   On: 06/12/2015 16:05    EKG: NSR, 81 BPM; occasional PVCs; TWI in leads I, II, V1, V4, V5, and V6  Weights: Filed Weights   06/12/15 1522 06/12/15 1900 06/13/15 0408  Weight: 240 lb (108.863 kg) 252 lb 11.2 oz (114.624 kg) 252 lb 11.1 oz (114.62 kg)     Physical Exam: Blood pressure 167/73, pulse 69, temperature 98.4 F (36.9 C), temperature source Oral, resp. rate 16, height  (1.575 m), weight 252 lb 11.1 oz (114.62 kg), SpO2 100 %. Body mass index is 46.21 kg/(m^2). General: Obese.  Very pleasant.  Well developed, well nourished, in no acute distress.  Sitting up in bed watching tv and eating breakfast. Wearing O2 via [place].    Head: Normocephalic, atraumatic, sclera non-icteric, no xanthomas, nares are without discharge.  Neck: Negative for carotid bruits. JVD not elevated. Lungs: Decreased breath sounds at bases bilaterally. No wheezes, rales, or rhonchi. Breathing is unlabored. Heart: RRR with S1 S2. No murmurs, rubs, or gallops appreciated. Abdomen: Obese. Soft, non-tender, non-distended with normoactive bowel sounds. No hepatomegaly. No rebound/guarding. No obvious abdominal masses. Msk:  Strength and tone appear normal for age. Extremities: No clubbing or cyanosis. 1+ pitting edema.  Distal pedal pulses  are 2+ and equal bilaterally. Neuro: Alert and oriented X 3. No facial asymmetry. No focal deficit. Moves all extremities spontaneously. Psych:  Responds to questions appropriately with a normal affect.    Assessment and Plan:   1. New  onset CHF and worsening dyspnea -Likely d/t uncontrolled HTN made worse by diet changes in the setting of new housing arrangements -New-onset leg edema, increased shortness of breath -X-ray: cardiomegaly with tiny effusions and minimal bibasilar atelectasis, consider CHF -Echo pending - treat based on results -Receiving Lasix 40 mg BID, carvedilol, clonidine, hydralazine, and losartan    2. Urgent Hypertension -BP 236/93 in the ED on 8/8 -Most recent BP on 8/9 167/73 -Continue Lasix 40 mg BID, carvedilol, clonidine, hydralazine, and losartan  -Increased hydralazine dose from 25 mg 4 times a day to 100 mg 4 times a day -Could change isosurbide to BID if BP lowering is still needed  3. Hypokalemia -Currently 3.2 -Up from K+ of 3.0 on 8/8 after receiving 20 mg PO potassium  -Receiving 20 mEq PO BID -In the setting of receiving diuresis, need to supplement more aggressively -40 mEq PO potassium ordered on 8/9 for immediate dosing  -Replete K+ until a goal of 4.0 is reached and maintain at that level  4. Hyperlipidemia -Continue lipitor  5. Obesity -Recommend weight loss  6. Diabetes -Per IM -On sliding scale insulin and Lantus every evening   7. Asthma -Per IM -Receiving DuoNed, albuterol, and Symbicort  8. Hypothyroidism -Per IM -On Levothyroxin  Signed, Eula Listen, PA-C Pager: [phone] 06/13/2015, 8:00 AM   Attending Note Patient seen and examined, agree with detailed note above,  Patient presentation and plan discussed on rounds.    progressive shortness of breath over the past 3 days Suspect some dietary indiscretion recently Was previously on Lasix in [place], discontinued for low potassium by primary care Severe  hypertension on arrival. She reports medication compliance. Blood pressure now significantly improved, Shortness of breath symptoms improved with diuresis ----Suspect acute on chronic diastolic CHF. Echo pending  --Would continue Lasix twice a day for now, stable renal function though will need careful monitoring --We'll give extra KCl 401 in addition to 20 twice a day dosing --Would increase hydralazine up to 100 mg 3 times a day If blood pressure continues to run high, could increase Imdur up to 30 mg twice a day --Long discussion concerning CHF, dietary issues, education She may benefit from CHF clinic consultation for while she is in town She does not have a scale at home. Recommended she purchase one, if not for here, at least in [place] --Body habitus raises concern for sleep apnea  Signed: Dossie Arbour  M.D., Ph.D.

## 2015-06-13 NOTE — Progress Notes (Signed)
Initial Nutrition Assessment  INTERVENTION:   Meals and Snacks: Cater to patient preferences Education: provided brief verbal and written education on low sodium diet/CHF; pt receptive to education, adhearence likely  NUTRITION DIAGNOSIS:   Food and nutrition related knowledge deficit related to acute illness as evidenced by  (requesting info on low sodium diet).  GOAL:   Patient will meet greater than or equal to 90% of their needs  MONITOR:    (Energy Intake, Anthropometrics, Electrolyte/Renal Profile, Digestive System)  REASON FOR ASSESSMENT:   Diagnosis    ASSESSMENT:   Pt admitted with new onset CHF, urgent HTN  Past Medical History  Diagnosis Date  . Hypertension, uncontrolled   . Insulin dependent diabetes mellitus   . Asthma   . Hypothyroidism   . Hyperlipidemia   . Obesity, Class III, BMI 40-49.9 (morbid obesity)     BMI 46.2    Diet Order:  Diet heart healthy/carb modified Room service appropriate?: Yes; Fluid consistency:: Thin   Energy Intake: recorded po intake 100% of meals  Food and Nutrition related history: pt reports good appetite prior to admission  Electrolyte and Renal Profile:  Recent Labs Lab 06/12/15 1537 06/12/15 1538 06/13/15 0400  BUN 26*  --  26*  CREATININE 1.29*  --  1.15*  NA 136  --  144  K 3.0*  --  3.2*  MG  --  1.9  --    Glucose Profile:   Recent Labs  06/12/15 1940 06/13/15 0741 06/13/15 1129  GLUCAP 76 79 137*   Protein Profile: No results for input(s): ALBUMIN in the last 168 hours.   Meds: ss novolog, lantus, aspart, lasix   Height:   Ht Readings from Last 1 Encounters:  06/12/15  (1.575 m)    Weight:   Wt Readings from Last 1 Encounters:  06/13/15 252 lb 11.1 oz (114.62 kg)   Filed Weights   06/12/15 1522 06/12/15 1900 06/13/15 0408  Weight: 240 lb (108.863 kg) 252 lb 11.2 oz (114.624 kg) 252 lb 11.1 oz (114.62 kg)    BMI:  Body mass index is 46.21 kg/(m^2).   LOW Care Level  Romelle Starcher MS, [place], LDN [phone] Pager

## 2015-06-13 NOTE — Progress Notes (Addendum)
[facility] - [place] at [facility]   PATIENT NAME: Ashley Graves    MR#:  [id number]  DATE OF BIRTH:  Sep 19, 1956  SUBJECTIVE:  CHIEF COMPLAINT:   Chief Complaint  Patient presents with  . Wheezing  Feels little better REVIEW OF SYSTEMS:  Review of Systems  Constitutional: Negative for fever, weight loss, malaise/fatigue and diaphoresis.  HENT: Negative for ear discharge, ear pain, hearing loss, nosebleeds, sore throat and tinnitus.   Eyes: Negative for blurred vision and pain.  Respiratory: Positive for shortness of breath. Negative for cough, hemoptysis and wheezing.   Cardiovascular: Negative for chest pain, palpitations, orthopnea and leg swelling.  Gastrointestinal: Negative for heartburn, nausea, vomiting, abdominal pain, diarrhea, constipation and blood in stool.  Genitourinary: Negative for dysuria, urgency and frequency.  Musculoskeletal: Negative for myalgias and back pain.  Skin: Negative for itching and rash.  Neurological: Negative for dizziness, tingling, tremors, focal weakness, seizures, weakness and headaches.  Psychiatric/Behavioral: Negative for depression. The patient is not nervous/anxious.    DRUG ALLERGIES:   Allergies  Allergen Reactions  . Codeine Other (See Comments)    Reaction:  Hallucinations    VITALS:  Blood pressure 167/73, pulse 69, temperature 98.4 F (36.9 C), temperature source Oral, resp. rate 16, height 5\' 2"  (1.575 m), weight 114.62 kg (252 lb 11.1 oz), SpO2 100 %. PHYSICAL EXAMINATION:  Physical Exam  Constitutional: She is oriented to person, place, and time and well-developed, well-nourished, and in no distress.  HENT:  Head: Normocephalic and atraumatic.  Eyes: Conjunctivae and EOM are normal. Pupils are equal, round, and reactive to light.  Neck: Normal range of motion. Neck supple. No tracheal deviation present. No thyromegaly present.  Cardiovascular: Normal rate, regular rhythm and normal heart  sounds.   Pulmonary/Chest: Effort normal and breath sounds normal. No respiratory distress. She has no wheezes. She exhibits no tenderness.  Abdominal: Soft. Bowel sounds are normal. She exhibits no distension. There is no tenderness.  Musculoskeletal: Normal range of motion. She exhibits edema (1 +).  Neurological: She is alert and oriented to person, place, and time. No cranial nerve deficit.  Skin: Skin is warm and dry. No rash noted.  Psychiatric: Mood and affect normal.   LABORATORY PANEL:   CBC  Recent Labs Lab 06/13/15 0400  WBC 8.5  HGB 12.3  HCT 38.4  PLT 202   ------------------------------------------------------------------------------------------------------------------ Chemistries   Recent Labs Lab 06/12/15 1538 06/13/15 0400  NA  --  144  K  --  3.2*  CL  --  99*  CO2  --  36*  GLUCOSE  --  157*  BUN  --  26*  CREATININE  --  1.15*  CALCIUM  --  7.8*  MG 1.9  --    RADIOLOGY:  Dg Chest 2 View  06/12/2015   CLINICAL DATA:  Mid chest pain.  Shortness of breath.  EXAM: CHEST  2 VIEW  COMPARISON:  None.  FINDINGS: There is cardiomegaly. There are tiny bilateral effusions. Pulmonary vascularity is within normal limits. Tiny areas atelectasis at both lung bases laterally. No osseous abnormality.  IMPRESSION: Cardiomegaly with tiny effusions and minimal bibasilar atelectasis. The possibility of congestive heart failure should be considered.   Electronically Signed   By: Francene Boyers M.D.   On: 06/12/2015 16:05   ASSESSMENT AND PLAN:  59 year old female with past medical history of obesity, diabetes, hypertension, asthma who presents to the hospital due to worsening lower extremity edema and shortness of breath and  noted to be in congestive heart failure.  #1 CHF-this is likely cause of patient's shortness of breath, lower extremity edema. - continue to diurese with IV Lasix, follow I's and O's, daily weights. -Continue Coreg. await two-dimensional echocardiogram  and cardiology consult.  #2 hypertensive urgency-patient's systolic blood pressures over 540. Thought to be due to noncompliance. -Continue Coreg, Imdur, clonidine, hydralazine. I will advance hydralazine to Q 6 hrs - continue prn hydralazine. monitor blood pressure.  #3 diabetes-continue Lantus, sliding scale insulin. Sugars well controlled  #4 hypothyroidism-continue Synthroid.  #5 asthma-no acute exacerbation. Continue Symbicort.  #6 Hypokalemia - replete and recheck    All the records are reviewed and case discussed with Care Management/Social Worker. Management plans discussed with the patient, family and they are in agreement.  CODE STATUS: Full  TOTAL TIME TAKING CARE OF THIS PATIENT: 35 minutes.   More than 50% of the time was spent in counseling/coordination of care: YES  POSSIBLE D/C IN 1-2 DAYS, DEPENDING ON CLINICAL CONDITION AND CARDIO EVAL   [facility], Anay Walter M.D on 06/13/2015 at 9:15 AM  Between 7am to 6pm - Pager - [phone]  After 6pm go to [email or web address] - password [email or web address] [facility]  [facility]  Office  [phone]  CC:  Primary care physician; No primary care provider on file.

## 2015-06-13 NOTE — Care Management Note (Signed)
Case Management Note  Patient Details  Name: Ashley Graves MRN: [id number] Date of Birth: Nov 02, 1956  Subjective/Objective:         58yo Ms Ashley Graves was admitted 06/12/15 per wheezing and dyspnea and was diagnosed with new onset CHF. In the ED Ms Ashley Graves Ashley Graves. Receiving IV lasix and several cardio meds. After med adjustments blood pressure is drifting down and is currently 180/70 today. Ms Ashley Graves resides in [place] and plans to return there as soon as she is medically able to travel. Presently she is visiting her daughter Ashley Graves ph: [phone] in [place]. Ms Ashley Graves has no home oxygen, no home equipment except a cane.PCP=Dr Darrick Penna in [place]. Ms Ashley Graves resides with a daughter and granddaughter in [place]. Case Management will follow for discharge planning. No home health anticipated. May need assistance with meds per Medicaid from [place].          Action/Plan:   Expected Discharge Date:                  Expected Discharge Plan:     In-House Referral:     Discharge planning Services     Post Acute Care Choice:    Choice offered to:     DME Arranged:    DME Agency:     HH Arranged:    HH Agency:     Status of Service:     Medicare Important Message Given:    Date Medicare IM Given:    Medicare IM give by:    Date Additional Medicare IM Given:    Additional Medicare Important Message give by:     If discussed at Long Length of Stay Meetings, dates discussed:    Additional Comments:  Esaias Cleavenger A, RN 06/13/2015, 3:55 PM

## 2015-06-13 NOTE — Progress Notes (Signed)
*  PRELIMINARY RESULTS* Echocardiogram 2D Echocardiogram has been performed.  Ashley Graves 06/13/2015, 9:34 AM

## 2015-06-14 ENCOUNTER — Telehealth: Payer: Self-pay

## 2015-06-14 ENCOUNTER — Inpatient Hospital Stay: Payer: Medicaid - Out of State

## 2015-06-14 LAB — BASIC METABOLIC PANEL
Anion gap: 10 (ref 5–15)
BUN: 29 mg/dL — ABNORMAL HIGH (ref 6–20)
CHLORIDE: 98 mmol/L — AB (ref 101–111)
CO2: 34 mmol/L — AB (ref 22–32)
Calcium: 7.9 mg/dL — ABNORMAL LOW (ref 8.9–10.3)
Creatinine, Ser: 1.17 mg/dL — ABNORMAL HIGH (ref 0.44–1.00)
GFR calc Af Amer: 57 mL/min — ABNORMAL LOW (ref >60)
GFR calc non Af Amer: 50 mL/min — ABNORMAL LOW (ref >60)
GLUCOSE: 59 mg/dL — AB (ref 65–99)
Potassium: 3.7 mmol/L (ref 3.5–5.1)
SODIUM: 142 mmol/L (ref 135–145)

## 2015-06-14 LAB — CBC
HCT: 37.6 % (ref 35.0–47.0)
Hemoglobin: 12 g/dL (ref 12.0–16.0)
MCH: 28.7 pg (ref 26.0–34.0)
MCHC: 31.9 g/dL — ABNORMAL LOW (ref 32.0–36.0)
MCV: 90 fL (ref 80.0–100.0)
Platelets: 204 10*3/uL (ref 150–440)
RBC: 4.18 MIL/uL (ref 3.80–5.20)
RDW: 14.9 % — ABNORMAL HIGH (ref 11.5–14.5)
WBC: 7.2 10*3/uL (ref 3.6–11.0)

## 2015-06-14 LAB — GLUCOSE, CAPILLARY
GLUCOSE-CAPILLARY: 135 mg/dL — AB (ref 65–99)
GLUCOSE-CAPILLARY: 180 mg/dL — AB (ref 65–99)

## 2015-06-14 MED ORDER — FUROSEMIDE 20 MG PO TABS: 20.0000 mg | ORAL_TABLET | Freq: Every day | ORAL | Status: AC

## 2015-06-14 MED ORDER — CLONIDINE HCL 0.1 MG PO TABS: 0.2000 mg | ORAL_TABLET | Freq: Two times a day (BID) | ORAL | Status: AC

## 2015-06-14 MED ORDER — POTASSIUM CHLORIDE CRYS ER 20 MEQ PO TBCR: 20.0000 meq | EXTENDED_RELEASE_TABLET | Freq: Every day | ORAL | Status: AC

## 2015-06-14 NOTE — Progress Notes (Signed)
Patient given discharge teaching and paperwork regarding medications, diet, follow-up appointments and activity. Patient understanding verbalized. No complaints at this time.  Skin assessment as previously charted and vitals are stable; on room air. Patient being discharged to home. No further needs by Care Management. Prescriptions sent to [facility] by physician. Patient waiting for ride home; IV and telemetry to be removed when transportation is here. No needs by Care Management.

## 2015-06-14 NOTE — Progress Notes (Signed)
[facility] called to double check clonidine dose for patient. Dr. Sherryll Burger notified - 0.2mg  twice a day is the correct dose the doctor wants.

## 2015-06-14 NOTE — Telephone Encounter (Signed)
Patient contacted regarding discharge from [facility] on 06/14/15.  Patient understands to follow up with Dr. Mariah Milling on 07/31/15 at 7:40 at [facility]. Patient understands discharge instructions? yes Patient understands medications and regiment? yes Patient understands to bring all medications to this visit? yes  Spoke w/ pt's daughter.  She will have pt call back w/ any questions or concerns.

## 2015-06-14 NOTE — Discharge Instructions (Signed)
Hypertension Hypertension is another name for high blood pressure. High blood pressure forces your heart to work harder to pump blood. A blood pressure reading has two numbers, which includes a higher number over a lower number (example: 110/72). HOME CARE   Have your blood pressure rechecked by your doctor.  Only take medicine as told by your doctor. Follow the directions carefully. The medicine does not work as well if you skip doses. Skipping doses also puts you at risk for problems.  Do not smoke.  Monitor your blood pressure at home as told by your doctor. GET HELP IF:  You think you are having a reaction to the medicine you are taking.  You have repeat headaches or feel dizzy.  You have puffiness (swelling) in your ankles.  You have trouble with your vision. GET HELP RIGHT AWAY IF:   You get a very bad headache and are confused.  You feel weak, numb, or faint.  You get chest or belly (abdominal) pain.  You throw up (vomit).  You cannot breathe very well. MAKE SURE YOU:   Understand these instructions.  Will watch your condition.  Will get help right away if you are not doing well or get worse. Document Released: 04/08/2008 Document Revised: 10/26/2013 Document Reviewed: 08/13/2013 ExitCare Patient Information 2015 [employer]. This information is not intended to replace advice given to you by your health care provider. Make sure you discuss any questions you have with your health care provider.  DASH Eating Plan DASH stands for "Dietary Approaches to Stop Hypertension." The DASH eating plan is a healthy eating plan that has been shown to reduce high blood pressure (hypertension). Additional health benefits may include reducing the risk of type 2 diabetes mellitus, heart disease, and stroke. The DASH eating plan may also help with weight loss. WHAT DO I NEED TO KNOW ABOUT THE DASH EATING PLAN? For the DASH eating plan, you will follow these general  guidelines:  Choose foods with a percent daily value for sodium of less than 5% (as listed on the food label).  Use salt-free seasonings or herbs instead of table salt or sea salt.  Check with your health care provider or pharmacist before using salt substitutes.  Eat lower-sodium products, often labeled as "lower sodium" or "no salt added."  Eat fresh foods.  Eat more vegetables, fruits, and low-fat dairy products.  Choose whole grains. Look for the word "whole" as the first word in the ingredient list.  Choose fish and skinless chicken or turkey more often than red meat. Limit fish, poultry, and meat to 6 oz (170 g) each day.  Limit sweets, desserts, sugars, and sugary drinks.  Choose heart-healthy fats.  Limit cheese to 1 oz (28 g) per day.  Eat more home-cooked food and less restaurant, buffet, and fast food.  Limit fried foods.  Cook foods using methods other than frying.  Limit canned vegetables. If you do use them, rinse them well to decrease the sodium.  When eating at a restaurant, ask that your food be prepared with less salt, or no salt if possible. WHAT FOODS CAN I EAT? Seek help from a dietitian for individual calorie needs. Grains Whole grain or whole wheat bread. Brown rice. Whole grain or whole wheat pasta. Quinoa, bulgur, and whole grain cereals. Low-sodium cereals. Corn or whole wheat flour tortillas. Whole grain cornbread. Whole grain crackers. Low-sodium crackers. Vegetables Fresh or frozen vegetables (raw, steamed, roasted, or grilled). Low-sodium or reduced-sodium tomato and vegetable juices. Low-sodium   or reduced-sodium tomato sauce and paste. Low-sodium or reduced-sodium canned vegetables.  Fruits All fresh, canned (in natural juice), or frozen fruits. Meat and Other Protein Products Ground beef (85% or leaner), grass-fed beef, or beef trimmed of fat. Skinless chicken or turkey. Ground chicken or turkey. Pork trimmed of fat. All fish and seafood.  Eggs. Dried beans, peas, or lentils. Unsalted nuts and seeds. Unsalted canned beans. Dairy Low-fat dairy products, such as skim or 1% milk, 2% or reduced-fat cheeses, low-fat ricotta or cottage cheese, or plain low-fat yogurt. Low-sodium or reduced-sodium cheeses. Fats and Oils Tub margarines without trans fats. Light or reduced-fat mayonnaise and salad dressings (reduced sodium). Avocado. Safflower, olive, or canola oils. Natural peanut or almond butter. Other Unsalted popcorn and pretzels. The items listed above may not be a complete list of recommended foods or beverages. Contact your dietitian for more options. WHAT FOODS ARE NOT RECOMMENDED? Grains White bread. White pasta. White rice. Refined cornbread. Bagels and croissants. Crackers that contain trans fat. Vegetables Creamed or fried vegetables. Vegetables in a cheese sauce. Regular canned vegetables. Regular canned tomato sauce and paste. Regular tomato and vegetable juices. Fruits Dried fruits. Canned fruit in light or heavy syrup. Fruit juice. Meat and Other Protein Products Fatty cuts of meat. Ribs, chicken wings, bacon, sausage, bologna, salami, chitterlings, fatback, hot dogs, bratwurst, and packaged luncheon meats. Salted nuts and seeds. Canned beans with salt. Dairy Whole or 2% milk, cream, half-and-half, and cream cheese. Whole-fat or sweetened yogurt. Full-fat cheeses or blue cheese. Nondairy creamers and whipped toppings. Processed cheese, cheese spreads, or cheese curds. Condiments Onion and garlic salt, seasoned salt, table salt, and sea salt. Canned and packaged gravies. Worcestershire sauce. Tartar sauce. Barbecue sauce. Teriyaki sauce. Soy sauce, including reduced sodium. Steak sauce. Fish sauce. Oyster sauce. Cocktail sauce. Horseradish. Ketchup and mustard. Meat flavorings and tenderizers. Bouillon cubes. Hot sauce. Tabasco sauce. Marinades. Taco seasonings. Relishes. Fats and Oils Butter, stick margarine, lard,  shortening, ghee, and bacon fat. Coconut, palm kernel, or palm oils. Regular salad dressings. Other Pickles and olives. Salted popcorn and pretzels. The items listed above may not be a complete list of foods and beverages to avoid. Contact your dietitian for more information. WHERE CAN I FIND MORE INFORMATION? National Heart, Lung, and Blood Institute: www.nhlbi.nih.gov/health/health-topics/topics/dash/ Document Released: 10/10/2011 Document Revised: 03/07/2014 Document Reviewed: 08/25/2013 ExitCare Patient Information 2015 [employer]. This information is not intended to replace advice given to you by your health care provider. Make sure you discuss any questions you have with your health care provider.  

## 2015-06-14 NOTE — Progress Notes (Signed)
Patient refusing bed alarm. Educated on safety.  

## 2015-06-14 NOTE — Telephone Encounter (Signed)
-----   Message from Coralee Rud sent at 06/14/2015 10:41 AM EDT ----- Regarding: tcm/ph 07/31/2015 7:40 DR. Gollan

## 2015-06-14 NOTE — Clinical Documentation Improvement (Signed)
  Please specify the Type and Acuity of CHF if known.  Acuity: Acute Chronic Acute on Chronic  Type: Diastolic Systolic Combined Diastolic/Systolic  06/12/15 ECHO:  EF= 55-60%  Thank you!  Sharyn Creamer, BSN, RN [place] [facility]/Clinical Documentation Specialist [email or web address] [phone]

## 2015-06-16 NOTE — Discharge Summary (Signed)
[facility] [place] [facility]   PATIENT NAME: Ashley Graves    MR#:  [id number]  DATE OF BIRTH:  03/05/1956  DATE OF ADMISSION:  06/12/2015 ADMITTING PHYSICIAN: Houston Siren, MD  DATE OF DISCHARGE: 06/14/2015 12:37 PM  PRIMARY CARE PHYSICIAN: Darrick Penna, MD   ADMISSION DIAGNOSIS:  Essential hypertension [I10] Congestive heart failure, unspecified congestive heart failure chronicity, unspecified congestive heart failure type [I50.9] DISCHARGE DIAGNOSIS:  Active Problems:   CHF (congestive heart failure)   Insulin dependent diabetes mellitus   Hypothyroidism   Hyperlipidemia   Obesity, Class III, BMI 40-49.9 (morbid obesity)   Hypertension, uncontrolled   Acute diastolic congestive heart failure   Essential hypertension  SECONDARY DIAGNOSIS:   Past Medical History  Diagnosis Date  . Hypertension, uncontrolled   . Insulin dependent diabetes mellitus   . Asthma   . Hypothyroidism   . Hyperlipidemia   . Obesity, Class III, BMI 40-49.9 (morbid obesity)     BMI 46.2   HOSPITAL COURSE:  59 y.o. female with a known history of hypertension, diabetes, asthma, obesity, who was admitted to the hospital with a one-week history of worsening shortness of breath and lower extremity edema. Please see Dr Hilbert Odor dictated H & P for further details. This was thought to be due to Acute Diastolic CHF- due to uncontrolled HTN made worse by diet changes in the setting of new housing arrangements. She underwent two-dimensional echocardiogram per cardiology consult which showed Diastolic Dysfunction. She was diuresed with IV lasix and was improving. She was about 3.2 liters Neg on the date of discharge.  She was also noted to have hypertensive urgency for which several adjustments were made in her BP meds (mainly increase dose/frequency) to get it under better control.  She was feeling much better by 10th Aug and was D/C home in stable condition. She was  agreeable with D/C Plans. DISCHARGE CONDITIONS:  Stable CONSULTS OBTAINED:    Antonieta Iba, MD - Cardiology  DRUG ALLERGIES:   Allergies  Allergen Reactions  . Codeine Other (See Comments)    Reaction:  Hallucinations     DISCHARGE MEDICATIONS:   Discharge Medication List as of 06/14/2015 11:24 AM    START taking these medications   Details  furosemide (LASIX) 20 MG tablet Take 1 tablet (20 mg total) by mouth daily., Starting 06/14/2015, Until Discontinued, Normal    potassium chloride SA (K-DUR,KLOR-CON) 20 MEQ tablet Take 1 tablet (20 mEq total) by mouth daily., Starting 06/14/2015, Until Discontinued, Normal      CONTINUE these medications which have CHANGED   Details  cloNIDine (CATAPRES) 0.1 MG tablet Take 2 tablets (0.2 mg total) by mouth 2 (two) times daily., Starting 06/14/2015, Until Discontinued, Normal      CONTINUE these medications which have NOT CHANGED   Details  albuterol (PROVENTIL HFA;VENTOLIN HFA) 108 (90 BASE) MCG/ACT inhaler Inhale 2 puffs into the lungs every 6 (six) hours as needed for wheezing or shortness of breath., Until Discontinued, Historical Med    aspirin EC 81 MG tablet Take 81 mg by mouth daily., Until Discontinued, Historical Med    atorvastatin (LIPITOR) 40 MG tablet Take 40 mg by mouth daily., Until Discontinued, Historical Med    budesonide-formoterol (SYMBICORT) 160-4.5 MCG/ACT inhaler Inhale 2 puffs into the lungs 2 (two) times daily., Until Discontinued, Historical Med    carvedilol (COREG) 25 MG tablet Take 25 mg by mouth 2 (two) times daily., Until Discontinued, Historical Med    hydrALAZINE (  APRESOLINE) 25 MG tablet Take 25 mg by mouth 2 (two) times daily., Until Discontinued, Historical Med    insulin glargine (LANTUS) 100 UNIT/ML injection Inject 35 Units into the skin every evening., Until Discontinued, Historical Med    insulin lispro (HUMALOG) 100 UNIT/ML injection Inject 7 Units into the skin 3 (three) times daily before  meals., Until Discontinued, Historical Med    isosorbide mononitrate (IMDUR) 30 MG 24 hr tablet Take 30 mg by mouth daily., Until Discontinued, Historical Med    levothyroxine (SYNTHROID, LEVOTHROID) 125 MCG tablet Take 125 mcg by mouth daily before breakfast., Until Discontinued, Historical Med    losartan (COZAAR) 100 MG tablet Take 100 mg by mouth daily., Until Discontinued, Historical Med    naproxen sodium (ANAPROX) 220 MG tablet Take 440 mg by mouth daily., Until Discontinued, Historical Med        DISCHARGE INSTRUCTIONS:   DIET:  Cardiac diet DISCHARGE CONDITION:  Good ACTIVITY:  Activity as tolerated OXYGEN:  Home Oxygen: No.  Oxygen Delivery: room air DISCHARGE LOCATION:  home   If you experience worsening of your admission symptoms, develop shortness of breath, life threatening emergency, suicidal or homicidal thoughts you must seek medical attention immediately by calling 911 or calling your MD immediately  if symptoms less severe.  You Must read complete instructions/literature along with all the possible adverse reactions/side effects for all the Medicines you take and that have been prescribed to you. Take any new Medicines after you have completely understood and accpet all the possible adverse reactions/side effects.   Please note  You were cared for by a hospitalist during your hospital stay. If you have any questions about your discharge medications or the care you received while you were in the hospital after you are discharged, you can call the unit and asked to speak with the hospitalist on call if the hospitalist that took care of you is not available. Once you are discharged, your primary care physician will handle any further medical issues. Please note that NO REFILLS for any discharge medications will be authorized once you are discharged, as it is imperative that you return to your primary care physician (or establish a relationship with a primary care  physician if you do not have one) for your aftercare needs so that they can reassess your need for medications and monitor your lab values.    On the day of Discharge:  VITAL SIGNS:  Blood pressure 177/82, pulse 72, temperature 98.1 F (36.7 C), temperature source Oral, resp. rate 18, height  (1.575 m), weight 114.488 kg (252 lb 6.4 oz), SpO2 97 %. PHYSICAL EXAMINATION:  GENERAL:  59 y.o.-year-old patient lying in the bed with no acute distress.  EYES: Pupils equal, round, reactive to light and accommodation. No scleral icterus. Extraocular muscles intact.  HEENT: Head atraumatic, normocephalic. Oropharynx and nasopharynx clear.  NECK:  Supple, no jugular venous distention. No thyroid enlargement, no tenderness.  LUNGS: Normal breath sounds bilaterally, no wheezing, rales,rhonchi or crepitation. No use of accessory muscles of respiration.  CARDIOVASCULAR: S1, S2 normal. No murmurs, rubs, or gallops.  ABDOMEN: Soft, non-tender, non-distended. Bowel sounds present. No organomegaly or mass.  EXTREMITIES: No pedal edema, cyanosis, or clubbing.  NEUROLOGIC: Cranial nerves II through XII are intact. Muscle strength 5/5 in all extremities. Sensation intact. Gait not checked.  PSYCHIATRIC: The patient is alert and oriented x 3.  SKIN: No obvious rash, lesion, or ulcer.  DATA REVIEW:   CBC  Recent Labs Lab 06/14/15  0404  WBC 7.2  HGB 12.0  HCT 37.6  PLT 204    Chemistries   Recent Labs Lab 06/12/15 1538  06/14/15 0404  NA  --   < > 142  K  --   < > 3.7  CL  --   < > 98*  CO2  --   < > 34*  GLUCOSE  --   < > 59*  BUN  --   < > 29*  CREATININE  --   < > 1.17*  CALCIUM  --   < > 7.9*  MG 1.9  --   --   < > = values in this interval not displayed.  Management plans discussed with the patient, family and they are in agreement.  CODE STATUS: Full Code  TOTAL TIME TAKING CARE OF THIS PATIENT: 55 minutes.    [facility], Jaritza Duignan M.D on 06/16/2015 at 11:26 PM  Between 7am to 6pm -  Pager - [phone]  After 6pm go to [email or web address] - password [email or web address] [facility]  [facility] [facility]  Office  [phone]  CC: Primary care physician; Darrick Penna, MD Antonieta Iba, MD

## 2015-07-31 ENCOUNTER — Encounter: Payer: Medicaid - Out of State | Admitting: Cardiovascular Disease

## 2016-09-30 IMAGING — CR DG CHEST 2V
1 series · 2 of 2 positions shown · non-contrast
Comparison: None.

CLINICAL DATA: Mid chest pain.  Shortness of breath.

EXAM:
CHEST  2 VIEW

[Series 1: dg chest 2 view · 0.14mm/px · 2 of 2 slices shown]
[im 1/2]
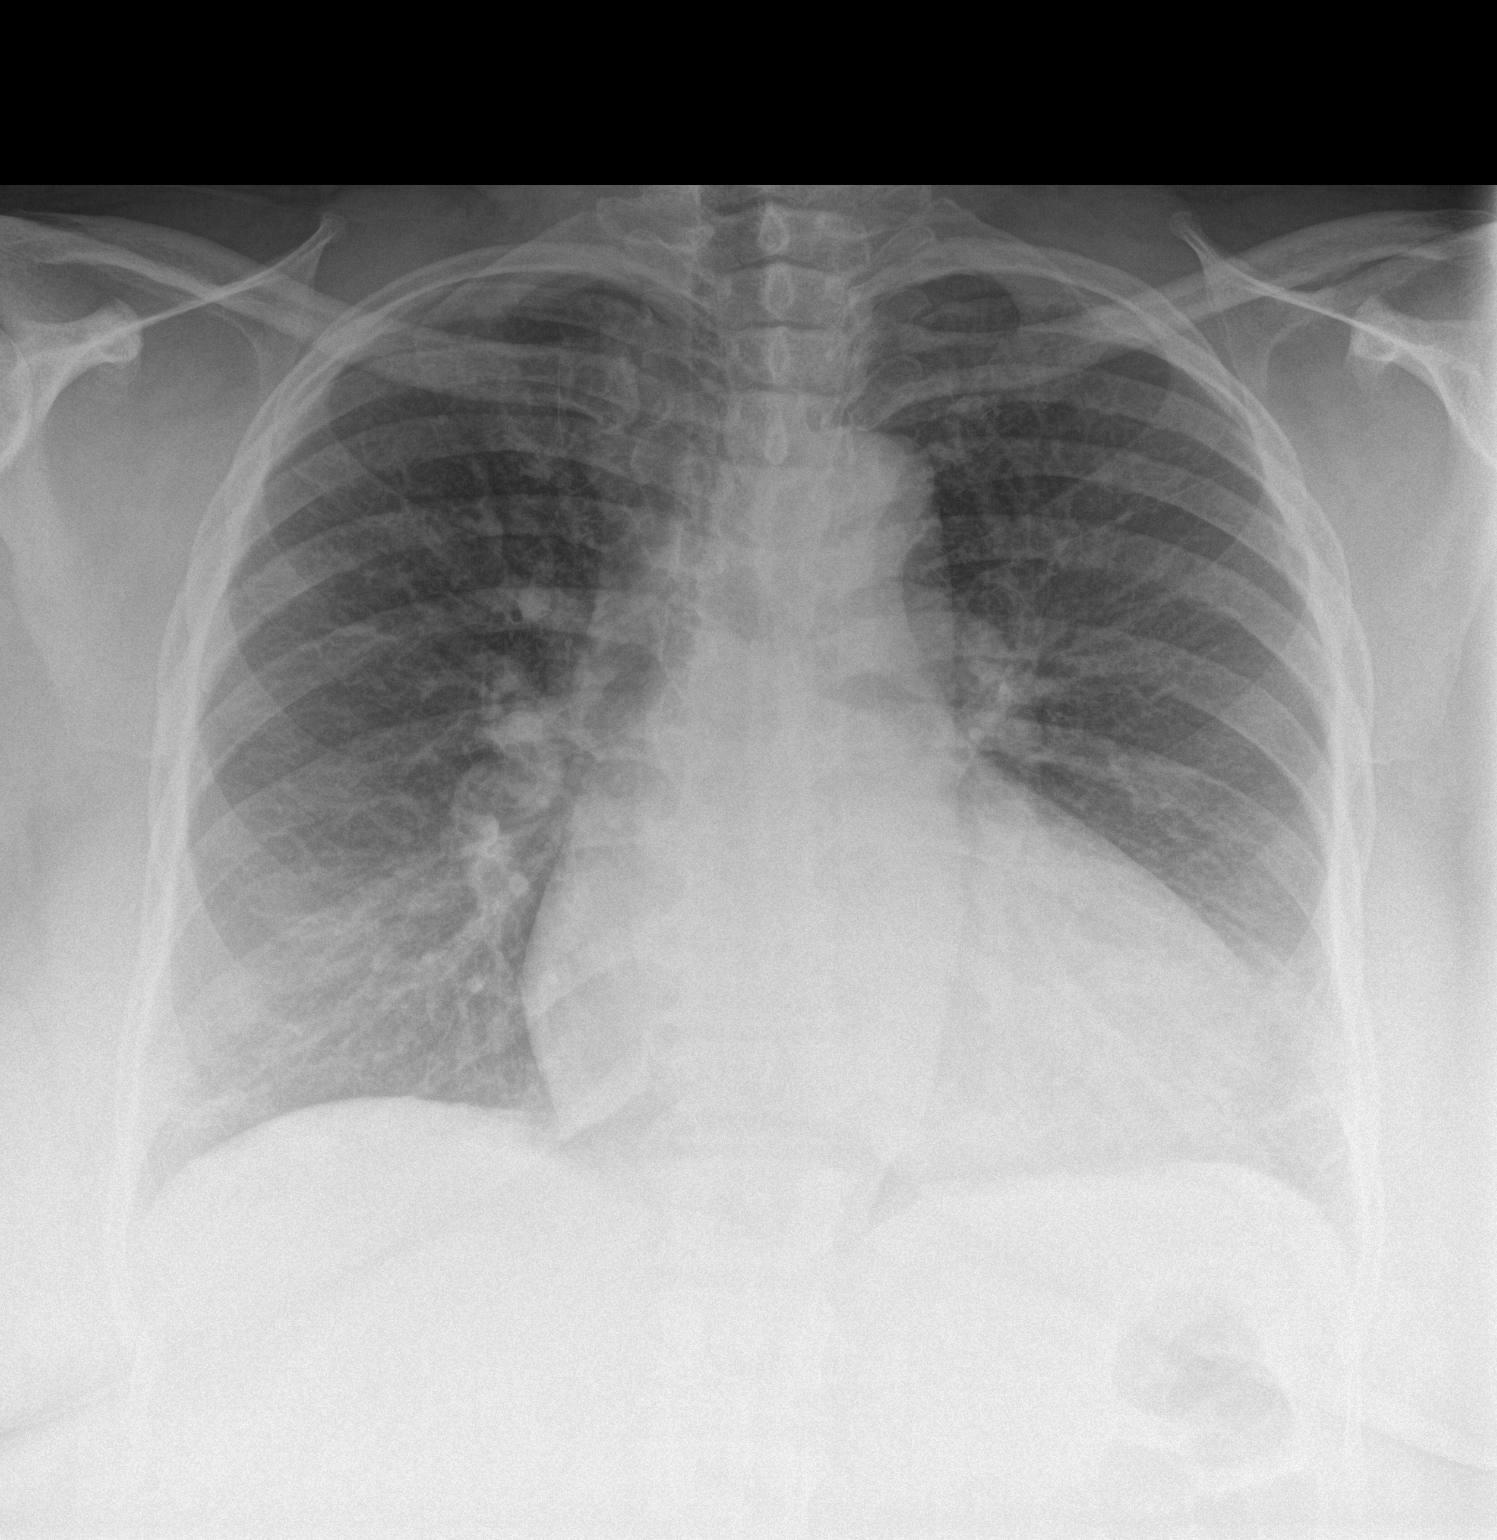
[im 2/2]
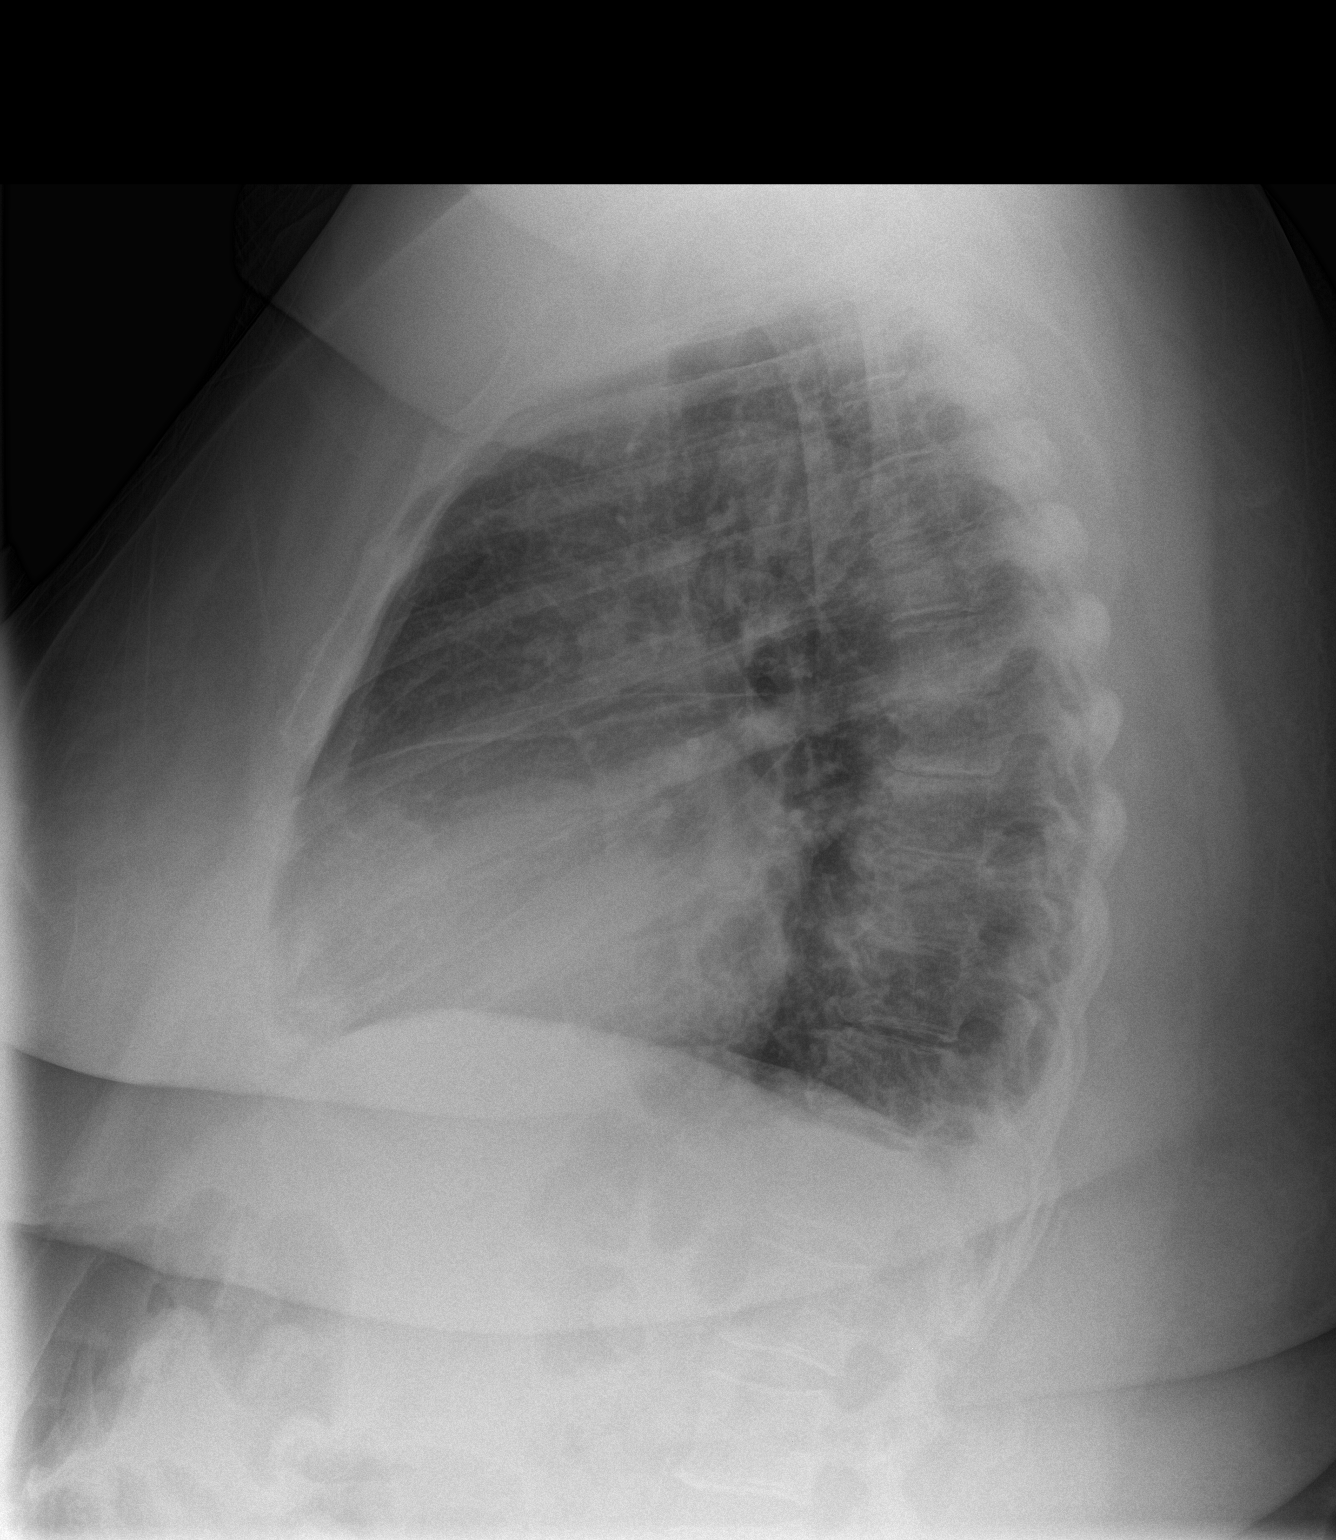

[2 of 2 positions shown; findings below may reference images not displayed]

FINDINGS: There is cardiomegaly. There are tiny bilateral effusions. Pulmonary
vascularity is within normal limits. Tiny areas atelectasis at both
lung bases laterally. No osseous abnormality.
IMPRESSION: Cardiomegaly with tiny effusions and minimal bibasilar atelectasis.
The possibility of congestive heart failure should be considered.

## 2022-03-04 DEATH — deceased
